# Patient Record
Sex: Male | Born: 1975 | Race: White | Hispanic: No | Marital: Married | Smoking: Current every day smoker
Health system: Southern US, Community
[De-identification: ages and names within clinical notes are randomized; demographics above are authoritative.]

## PROBLEM LIST (undated history)

## (undated) DIAGNOSIS — K529 Noninfective gastroenteritis and colitis, unspecified: Secondary | ICD-10-CM

## (undated) HISTORY — PX: BACK SURGERY: SHX140

## (undated) HISTORY — PX: OTHER SURGICAL HISTORY: SHX169

---

## 2000-06-22 ENCOUNTER — Encounter: Payer: Self-pay | Admitting: Emergency Medicine

## 2000-06-22 ENCOUNTER — Emergency Department (HOSPITAL_COMMUNITY): Admission: EM | Admit: 2000-06-22 | Discharge: 2000-06-22 | Payer: Self-pay | Admitting: Emergency Medicine

## 2000-07-01 ENCOUNTER — Emergency Department (HOSPITAL_COMMUNITY): Admission: EM | Admit: 2000-07-01 | Discharge: 2000-07-01 | Payer: Self-pay | Admitting: Emergency Medicine

## 2000-10-30 ENCOUNTER — Emergency Department (HOSPITAL_COMMUNITY): Admission: EM | Admit: 2000-10-30 | Discharge: 2000-10-30 | Payer: Self-pay | Admitting: Internal Medicine

## 2000-11-30 ENCOUNTER — Encounter: Payer: Self-pay | Admitting: Internal Medicine

## 2000-11-30 ENCOUNTER — Emergency Department (HOSPITAL_COMMUNITY): Admission: EM | Admit: 2000-11-30 | Discharge: 2000-11-30 | Payer: Self-pay | Admitting: Internal Medicine

## 2000-12-09 ENCOUNTER — Encounter: Payer: Self-pay | Admitting: Emergency Medicine

## 2000-12-09 ENCOUNTER — Emergency Department (HOSPITAL_COMMUNITY): Admission: EM | Admit: 2000-12-09 | Discharge: 2000-12-09 | Payer: Self-pay | Admitting: Emergency Medicine

## 2000-12-25 ENCOUNTER — Emergency Department (HOSPITAL_COMMUNITY): Admission: EM | Admit: 2000-12-25 | Discharge: 2000-12-26 | Payer: Self-pay | Admitting: *Deleted

## 2001-01-01 ENCOUNTER — Emergency Department (HOSPITAL_COMMUNITY): Admission: EM | Admit: 2001-01-01 | Discharge: 2001-01-01 | Payer: Self-pay | Admitting: Emergency Medicine

## 2001-03-08 ENCOUNTER — Emergency Department (HOSPITAL_COMMUNITY): Admission: EM | Admit: 2001-03-08 | Discharge: 2001-03-08 | Payer: Self-pay | Admitting: *Deleted

## 2001-03-24 ENCOUNTER — Emergency Department (HOSPITAL_COMMUNITY): Admission: EM | Admit: 2001-03-24 | Discharge: 2001-03-24 | Payer: Self-pay | Admitting: Emergency Medicine

## 2001-03-27 ENCOUNTER — Encounter: Payer: Self-pay | Admitting: Emergency Medicine

## 2001-03-27 ENCOUNTER — Emergency Department (HOSPITAL_COMMUNITY): Admission: EM | Admit: 2001-03-27 | Discharge: 2001-03-27 | Payer: Self-pay | Admitting: Emergency Medicine

## 2001-04-15 ENCOUNTER — Inpatient Hospital Stay (HOSPITAL_COMMUNITY): Admission: AC | Admit: 2001-04-15 | Discharge: 2001-04-17 | Payer: Self-pay

## 2001-04-15 ENCOUNTER — Encounter: Payer: Self-pay | Admitting: Emergency Medicine

## 2001-04-15 ENCOUNTER — Encounter: Payer: Self-pay | Admitting: Neurosurgery

## 2001-04-26 ENCOUNTER — Emergency Department (HOSPITAL_COMMUNITY): Admission: EM | Admit: 2001-04-26 | Discharge: 2001-04-26 | Payer: Self-pay

## 2001-04-29 ENCOUNTER — Encounter: Payer: Self-pay | Admitting: *Deleted

## 2001-04-29 ENCOUNTER — Emergency Department (HOSPITAL_COMMUNITY): Admission: EM | Admit: 2001-04-29 | Discharge: 2001-04-29 | Payer: Self-pay | Admitting: *Deleted

## 2001-04-30 ENCOUNTER — Emergency Department (HOSPITAL_COMMUNITY): Admission: EM | Admit: 2001-04-30 | Discharge: 2001-05-01 | Payer: Self-pay | Admitting: *Deleted

## 2001-06-10 ENCOUNTER — Emergency Department (HOSPITAL_COMMUNITY): Admission: EM | Admit: 2001-06-10 | Discharge: 2001-06-10 | Payer: Self-pay | Admitting: *Deleted

## 2001-06-18 ENCOUNTER — Emergency Department (HOSPITAL_COMMUNITY): Admission: EM | Admit: 2001-06-18 | Discharge: 2001-06-18 | Payer: Self-pay | Admitting: Emergency Medicine

## 2001-07-02 ENCOUNTER — Emergency Department (HOSPITAL_COMMUNITY): Admission: EM | Admit: 2001-07-02 | Discharge: 2001-07-02 | Payer: Self-pay | Admitting: *Deleted

## 2001-07-02 ENCOUNTER — Encounter: Payer: Self-pay | Admitting: *Deleted

## 2001-07-16 ENCOUNTER — Emergency Department (HOSPITAL_COMMUNITY): Admission: EM | Admit: 2001-07-16 | Discharge: 2001-07-16 | Payer: Self-pay | Admitting: Emergency Medicine

## 2001-07-18 ENCOUNTER — Emergency Department (HOSPITAL_COMMUNITY): Admission: EM | Admit: 2001-07-18 | Discharge: 2001-07-18 | Payer: Self-pay | Admitting: Emergency Medicine

## 2001-07-21 ENCOUNTER — Emergency Department (HOSPITAL_COMMUNITY): Admission: EM | Admit: 2001-07-21 | Discharge: 2001-07-21 | Payer: Self-pay | Admitting: *Deleted

## 2001-07-23 ENCOUNTER — Emergency Department (HOSPITAL_COMMUNITY): Admission: EM | Admit: 2001-07-23 | Discharge: 2001-07-23 | Payer: Self-pay | Admitting: Emergency Medicine

## 2001-07-29 ENCOUNTER — Emergency Department (HOSPITAL_COMMUNITY): Admission: EM | Admit: 2001-07-29 | Discharge: 2001-07-29 | Payer: Self-pay | Admitting: Emergency Medicine

## 2001-08-03 ENCOUNTER — Emergency Department (HOSPITAL_COMMUNITY): Admission: EM | Admit: 2001-08-03 | Discharge: 2001-08-03 | Payer: Self-pay | Admitting: Emergency Medicine

## 2001-08-06 ENCOUNTER — Emergency Department (HOSPITAL_COMMUNITY): Admission: EM | Admit: 2001-08-06 | Discharge: 2001-08-07 | Payer: Self-pay | Admitting: Internal Medicine

## 2001-08-07 ENCOUNTER — Encounter: Payer: Self-pay | Admitting: Internal Medicine

## 2001-08-24 ENCOUNTER — Emergency Department (HOSPITAL_COMMUNITY): Admission: EM | Admit: 2001-08-24 | Discharge: 2001-08-24 | Payer: Self-pay | Admitting: *Deleted

## 2001-09-14 ENCOUNTER — Emergency Department (HOSPITAL_COMMUNITY): Admission: EM | Admit: 2001-09-14 | Discharge: 2001-09-14 | Payer: Self-pay | Admitting: Emergency Medicine

## 2002-01-30 ENCOUNTER — Encounter: Payer: Self-pay | Admitting: Emergency Medicine

## 2002-01-30 ENCOUNTER — Emergency Department (HOSPITAL_COMMUNITY): Admission: EM | Admit: 2002-01-30 | Discharge: 2002-01-30 | Payer: Self-pay | Admitting: Emergency Medicine

## 2004-09-22 ENCOUNTER — Emergency Department (HOSPITAL_COMMUNITY): Admission: EM | Admit: 2004-09-22 | Discharge: 2004-09-23 | Payer: Self-pay | Admitting: *Deleted

## 2004-09-25 ENCOUNTER — Emergency Department: Payer: Self-pay | Admitting: Emergency Medicine

## 2004-10-16 ENCOUNTER — Emergency Department (HOSPITAL_COMMUNITY): Admission: EM | Admit: 2004-10-16 | Discharge: 2004-10-16 | Payer: Self-pay | Admitting: Emergency Medicine

## 2004-10-22 ENCOUNTER — Emergency Department (HOSPITAL_COMMUNITY): Admission: EM | Admit: 2004-10-22 | Discharge: 2004-10-22 | Payer: Self-pay | Admitting: Emergency Medicine

## 2004-10-24 ENCOUNTER — Emergency Department (HOSPITAL_COMMUNITY): Admission: EM | Admit: 2004-10-24 | Discharge: 2004-10-24 | Payer: Self-pay | Admitting: *Deleted

## 2004-12-16 ENCOUNTER — Emergency Department (HOSPITAL_COMMUNITY): Admission: EM | Admit: 2004-12-16 | Discharge: 2004-12-16 | Payer: Self-pay | Admitting: Emergency Medicine

## 2005-02-04 ENCOUNTER — Emergency Department (HOSPITAL_COMMUNITY): Admission: EM | Admit: 2005-02-04 | Discharge: 2005-02-05 | Payer: Self-pay | Admitting: *Deleted

## 2005-02-13 ENCOUNTER — Emergency Department (HOSPITAL_COMMUNITY): Admission: EM | Admit: 2005-02-13 | Discharge: 2005-02-13 | Payer: Self-pay | Admitting: Emergency Medicine

## 2005-02-27 ENCOUNTER — Emergency Department (HOSPITAL_COMMUNITY): Admission: EM | Admit: 2005-02-27 | Discharge: 2005-02-27 | Payer: Self-pay | Admitting: Emergency Medicine

## 2005-03-20 ENCOUNTER — Emergency Department (HOSPITAL_COMMUNITY): Admission: EM | Admit: 2005-03-20 | Discharge: 2005-03-20 | Payer: Self-pay | Admitting: Emergency Medicine

## 2005-04-06 ENCOUNTER — Emergency Department (HOSPITAL_COMMUNITY): Admission: EM | Admit: 2005-04-06 | Discharge: 2005-04-06 | Payer: Self-pay | Admitting: Emergency Medicine

## 2005-04-23 ENCOUNTER — Emergency Department (HOSPITAL_COMMUNITY): Admission: EM | Admit: 2005-04-23 | Discharge: 2005-04-23 | Payer: Self-pay | Admitting: Emergency Medicine

## 2005-06-03 ENCOUNTER — Emergency Department (HOSPITAL_COMMUNITY): Admission: EM | Admit: 2005-06-03 | Discharge: 2005-06-04 | Payer: Self-pay | Admitting: Emergency Medicine

## 2005-06-11 ENCOUNTER — Emergency Department (HOSPITAL_COMMUNITY): Admission: EM | Admit: 2005-06-11 | Discharge: 2005-06-11 | Payer: Self-pay | Admitting: Emergency Medicine

## 2005-06-27 ENCOUNTER — Emergency Department (HOSPITAL_COMMUNITY): Admission: EM | Admit: 2005-06-27 | Discharge: 2005-06-27 | Payer: Self-pay | Admitting: Emergency Medicine

## 2005-07-25 ENCOUNTER — Emergency Department (HOSPITAL_COMMUNITY): Admission: EM | Admit: 2005-07-25 | Discharge: 2005-07-25 | Payer: Self-pay | Admitting: Emergency Medicine

## 2005-07-31 ENCOUNTER — Emergency Department (HOSPITAL_COMMUNITY): Admission: EM | Admit: 2005-07-31 | Discharge: 2005-08-01 | Payer: Self-pay | Admitting: Emergency Medicine

## 2005-08-09 ENCOUNTER — Emergency Department (HOSPITAL_COMMUNITY): Admission: EM | Admit: 2005-08-09 | Discharge: 2005-08-10 | Payer: Self-pay | Admitting: Emergency Medicine

## 2005-08-13 ENCOUNTER — Emergency Department (HOSPITAL_COMMUNITY): Admission: EM | Admit: 2005-08-13 | Discharge: 2005-08-13 | Payer: Self-pay | Admitting: Emergency Medicine

## 2005-08-14 ENCOUNTER — Emergency Department (HOSPITAL_COMMUNITY): Admission: EM | Admit: 2005-08-14 | Discharge: 2005-08-14 | Payer: Self-pay | Admitting: Emergency Medicine

## 2005-08-16 ENCOUNTER — Ambulatory Visit: Payer: Self-pay | Admitting: Orthopedic Surgery

## 2005-08-20 ENCOUNTER — Ambulatory Visit: Payer: Self-pay | Admitting: Orthopedic Surgery

## 2005-08-20 ENCOUNTER — Emergency Department (HOSPITAL_COMMUNITY): Admission: EM | Admit: 2005-08-20 | Discharge: 2005-08-20 | Payer: Self-pay | Admitting: Emergency Medicine

## 2005-08-26 ENCOUNTER — Emergency Department (HOSPITAL_COMMUNITY): Admission: EM | Admit: 2005-08-26 | Discharge: 2005-08-26 | Payer: Self-pay | Admitting: Emergency Medicine

## 2005-08-29 ENCOUNTER — Ambulatory Visit: Payer: Self-pay | Admitting: Orthopedic Surgery

## 2005-09-03 ENCOUNTER — Emergency Department (HOSPITAL_COMMUNITY): Admission: EM | Admit: 2005-09-03 | Discharge: 2005-09-03 | Payer: Self-pay | Admitting: Emergency Medicine

## 2005-09-13 ENCOUNTER — Ambulatory Visit: Payer: Self-pay | Admitting: Orthopedic Surgery

## 2005-09-14 ENCOUNTER — Emergency Department (HOSPITAL_COMMUNITY): Admission: EM | Admit: 2005-09-14 | Discharge: 2005-09-14 | Payer: Self-pay | Admitting: Emergency Medicine

## 2005-10-01 ENCOUNTER — Emergency Department (HOSPITAL_COMMUNITY): Admission: EM | Admit: 2005-10-01 | Discharge: 2005-10-01 | Payer: Self-pay | Admitting: Emergency Medicine

## 2005-10-04 ENCOUNTER — Ambulatory Visit: Payer: Self-pay | Admitting: Orthopedic Surgery

## 2005-10-09 ENCOUNTER — Emergency Department (HOSPITAL_COMMUNITY): Admission: EM | Admit: 2005-10-09 | Discharge: 2005-10-09 | Payer: Self-pay | Admitting: Emergency Medicine

## 2005-10-24 ENCOUNTER — Ambulatory Visit: Payer: Self-pay | Admitting: Orthopedic Surgery

## 2005-11-12 ENCOUNTER — Emergency Department (HOSPITAL_COMMUNITY): Admission: EM | Admit: 2005-11-12 | Discharge: 2005-11-12 | Payer: Self-pay | Admitting: Emergency Medicine

## 2005-11-21 ENCOUNTER — Ambulatory Visit: Payer: Self-pay | Admitting: Orthopedic Surgery

## 2005-12-18 ENCOUNTER — Emergency Department (HOSPITAL_COMMUNITY): Admission: EM | Admit: 2005-12-18 | Discharge: 2005-12-18 | Payer: Self-pay | Admitting: Emergency Medicine

## 2005-12-19 ENCOUNTER — Emergency Department (HOSPITAL_COMMUNITY): Admission: EM | Admit: 2005-12-19 | Discharge: 2005-12-19 | Payer: Self-pay | Admitting: Emergency Medicine

## 2005-12-22 ENCOUNTER — Emergency Department (HOSPITAL_COMMUNITY): Admission: EM | Admit: 2005-12-22 | Discharge: 2005-12-22 | Payer: Self-pay | Admitting: Emergency Medicine

## 2005-12-23 ENCOUNTER — Emergency Department (HOSPITAL_COMMUNITY): Admission: EM | Admit: 2005-12-23 | Discharge: 2005-12-23 | Payer: Self-pay | Admitting: Emergency Medicine

## 2006-01-06 ENCOUNTER — Emergency Department (HOSPITAL_COMMUNITY): Admission: EM | Admit: 2006-01-06 | Discharge: 2006-01-06 | Payer: Self-pay | Admitting: Emergency Medicine

## 2006-01-17 ENCOUNTER — Emergency Department (HOSPITAL_COMMUNITY): Admission: EM | Admit: 2006-01-17 | Discharge: 2006-01-17 | Payer: Self-pay | Admitting: Emergency Medicine

## 2006-02-18 ENCOUNTER — Emergency Department (HOSPITAL_COMMUNITY): Admission: EM | Admit: 2006-02-18 | Discharge: 2006-02-19 | Payer: Self-pay | Admitting: Emergency Medicine

## 2006-03-03 ENCOUNTER — Emergency Department (HOSPITAL_COMMUNITY): Admission: EM | Admit: 2006-03-03 | Discharge: 2006-03-03 | Payer: Self-pay | Admitting: Emergency Medicine

## 2006-04-20 ENCOUNTER — Emergency Department (HOSPITAL_COMMUNITY): Admission: EM | Admit: 2006-04-20 | Discharge: 2006-04-20 | Payer: Self-pay | Admitting: Emergency Medicine

## 2006-06-21 ENCOUNTER — Emergency Department (HOSPITAL_COMMUNITY): Admission: EM | Admit: 2006-06-21 | Discharge: 2006-06-22 | Payer: Self-pay | Admitting: Emergency Medicine

## 2006-06-26 ENCOUNTER — Emergency Department (HOSPITAL_COMMUNITY): Admission: EM | Admit: 2006-06-26 | Discharge: 2006-06-26 | Payer: Self-pay | Admitting: Emergency Medicine

## 2006-07-01 ENCOUNTER — Emergency Department (HOSPITAL_COMMUNITY): Admission: EM | Admit: 2006-07-01 | Discharge: 2006-07-01 | Payer: Self-pay | Admitting: Emergency Medicine

## 2006-08-07 ENCOUNTER — Emergency Department (HOSPITAL_COMMUNITY): Admission: EM | Admit: 2006-08-07 | Discharge: 2006-08-07 | Payer: Self-pay | Admitting: Emergency Medicine

## 2006-08-12 ENCOUNTER — Emergency Department (HOSPITAL_COMMUNITY): Admission: EM | Admit: 2006-08-12 | Discharge: 2006-08-12 | Payer: Self-pay | Admitting: Emergency Medicine

## 2006-09-20 ENCOUNTER — Emergency Department (HOSPITAL_COMMUNITY): Admission: EM | Admit: 2006-09-20 | Discharge: 2006-09-20 | Payer: Self-pay | Admitting: Emergency Medicine

## 2006-12-23 ENCOUNTER — Emergency Department (HOSPITAL_COMMUNITY): Admission: EM | Admit: 2006-12-23 | Discharge: 2006-12-23 | Payer: Self-pay | Admitting: Emergency Medicine

## 2007-01-06 ENCOUNTER — Emergency Department (HOSPITAL_COMMUNITY): Admission: EM | Admit: 2007-01-06 | Discharge: 2007-01-06 | Payer: Self-pay | Admitting: Emergency Medicine

## 2008-05-27 IMAGING — CT CT L SPINE W/O CM
3 series · 16 of 33 positions shown, 19 images · IV contrast (agent unspecified)
Comparison: none

CLINICAL DATA: Fall from 15-foot scaffolding.  Severe back pain.  Pelvis and left hip pain.
LUMBAR SPINE CT WITHOUT CONTRAST:
TECHNIQUE: Multidetector CT imaging of the lumbar spine was performed.  Multiplanar CT image reconstructions were also generated.
TECHNIQUE: Multidetector CT imaging of the pelvis was performed following the standard protocol without IV contrast.
TECHNIQUE: Multidetector CT imaging of the cervical spine was performed.  Multiplanar CT image reconstructions were also generated.

[Series 4285: — · axial · 0.35mm/px · z∈[-1415,-1175]mm · 8 of 178 slices shown, 10 images]
[im 14/178  soft-tissue]
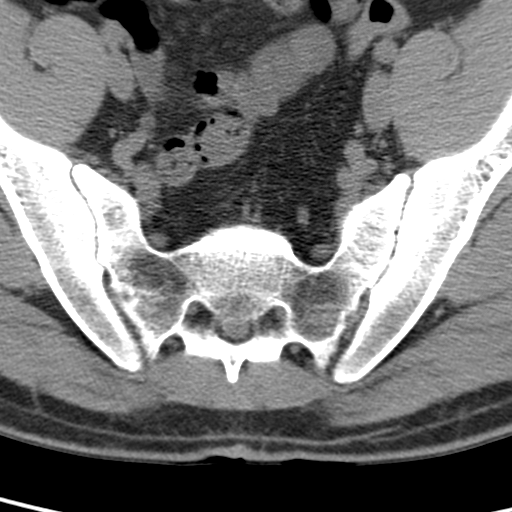
[im 14/178  bone]
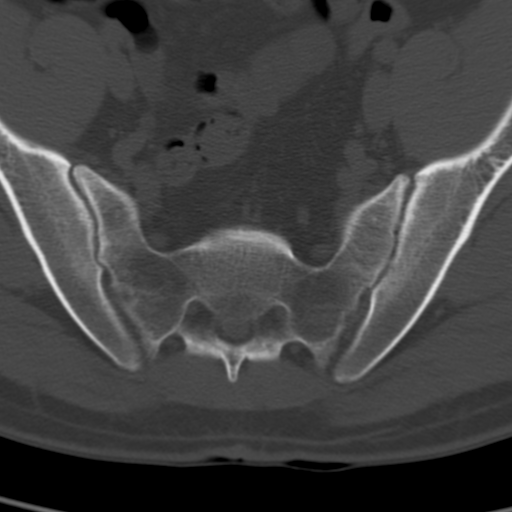
[im 41/178  bone]
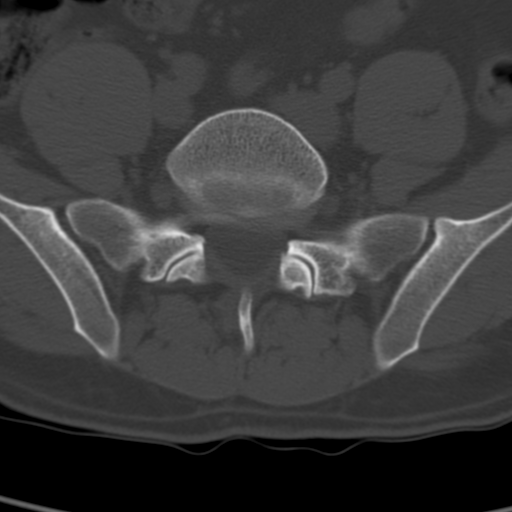
[im 55/178  bone]
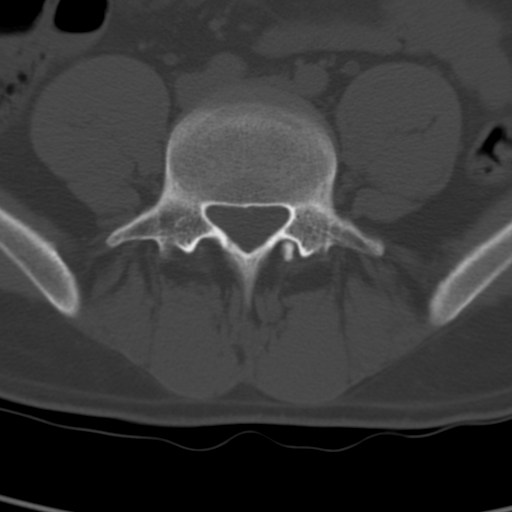
[im 82/178  bone]
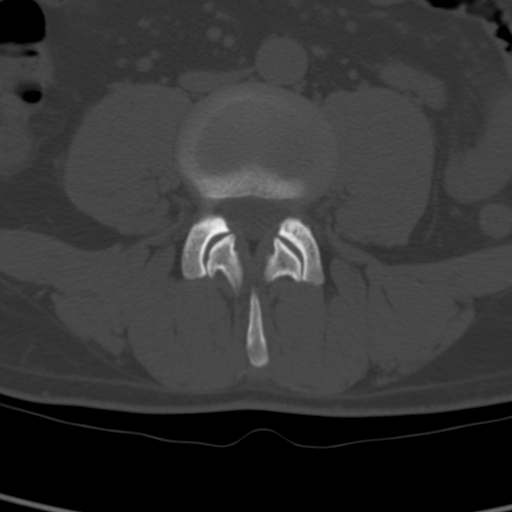
[im 96/178  soft-tissue]
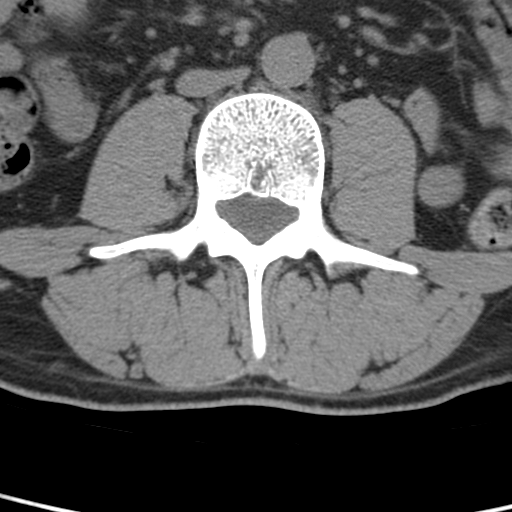
[im 96/178  bone]
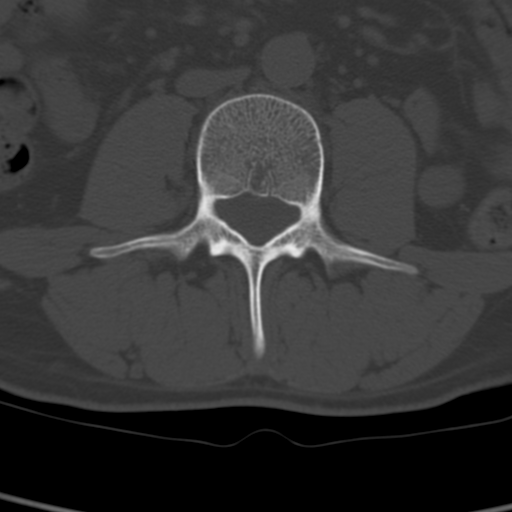
[im 123/178  bone]
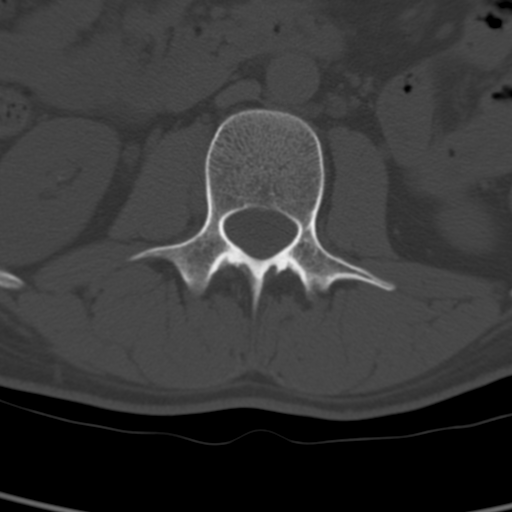
[im 137/178  bone]
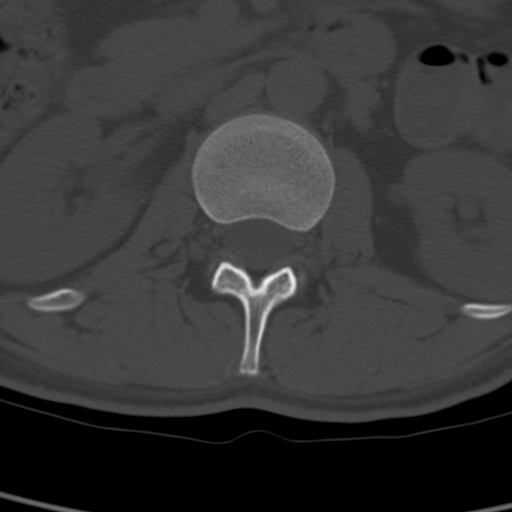
[im 164/178  bone]
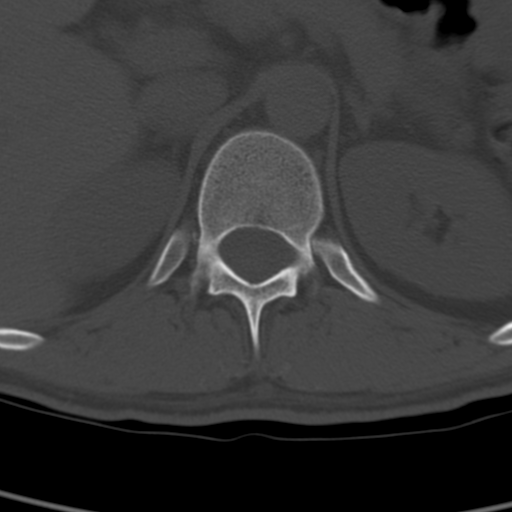

[sagittals · sagittal · 0.56mm/px · 5 of 70 slices shown, 6 images]
[im 24/70  bone]
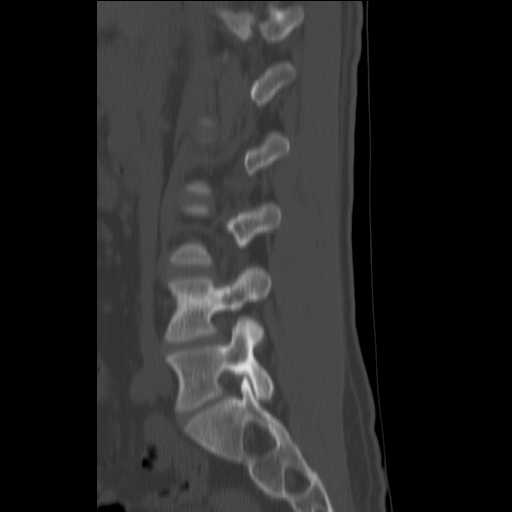
[im 29/70  bone]
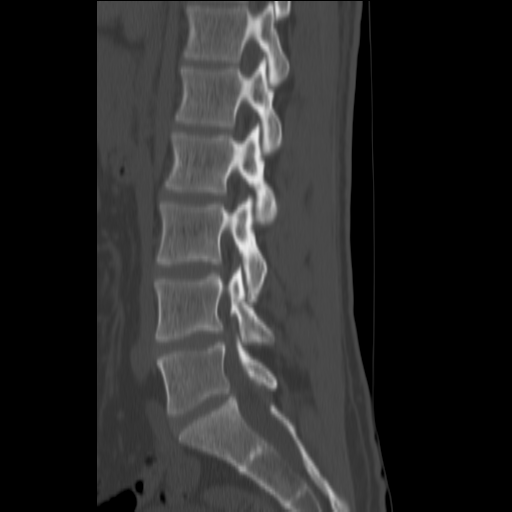
[im 35/70  soft-tissue]
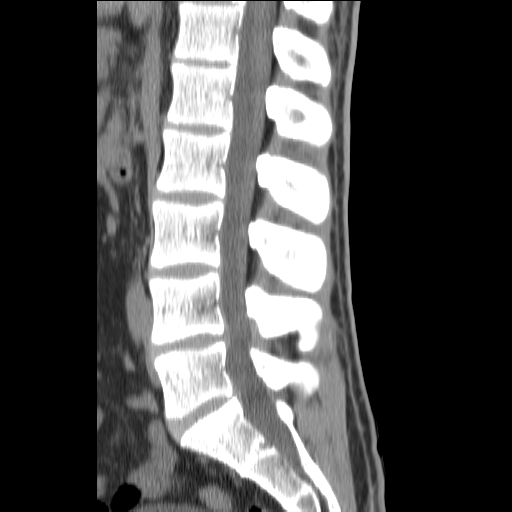
[im 35/70  bone]
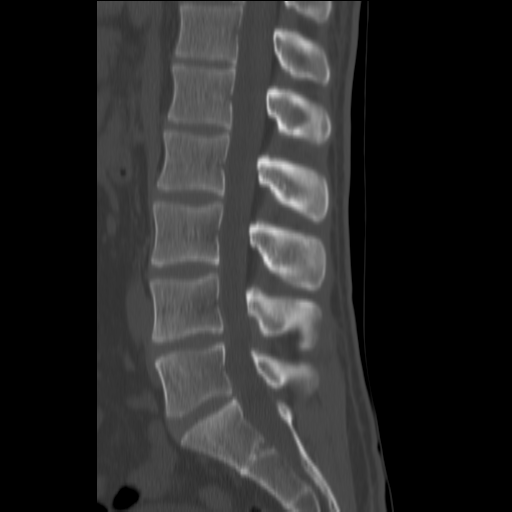
[im 41/70  bone]
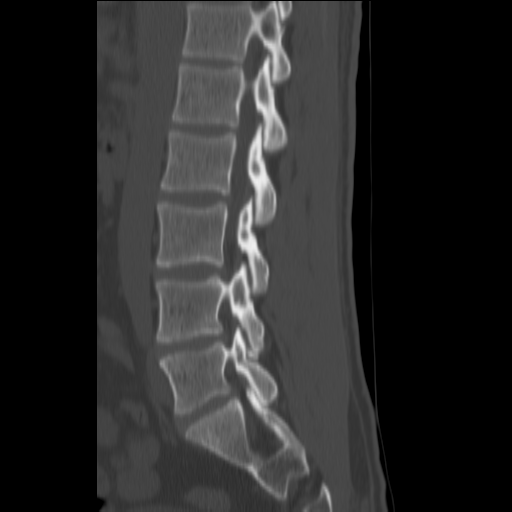
[im 47/70  bone]
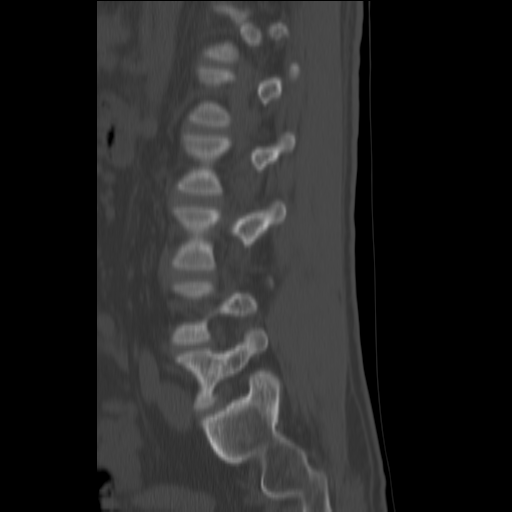

[coronals · coronal · 0.56mm/px · 3 of 55 slices shown]
[im 11/55  bone]
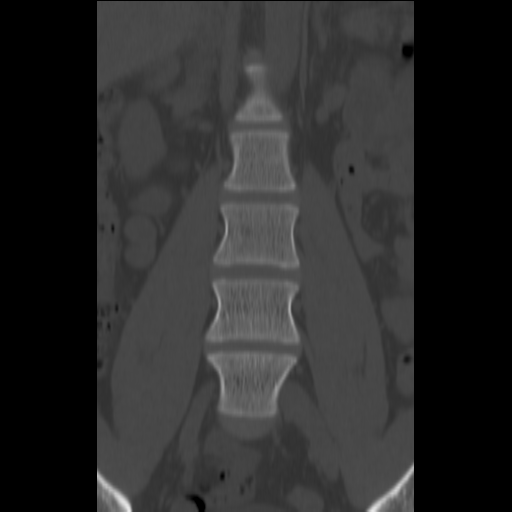
[im 22/55  bone]
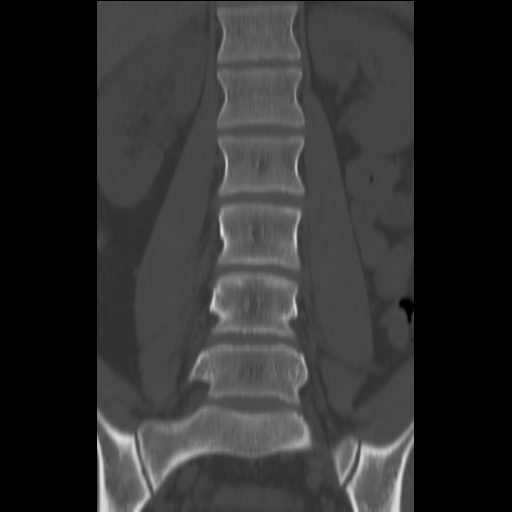
[im 33/55  bone]
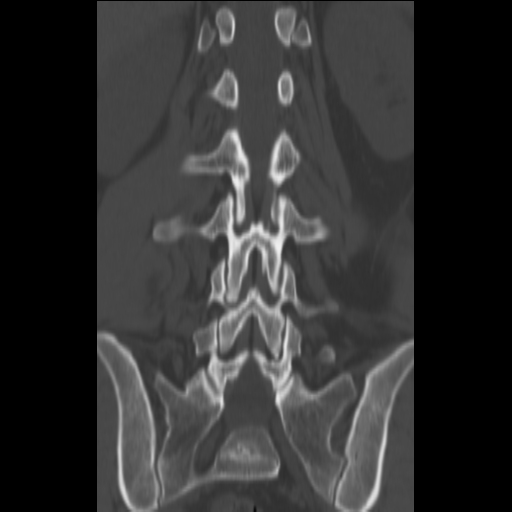

[16 of 33 positions shown; findings below may reference images not displayed]

FINDINGS: Lumbar spine alignment is anatomic.   No compression fracture or deformity.   Vertebral body heights and disc spaces are preserved.   Mild L3-4 disc bulge.   No significant stenosis.  Mild acquired stenosis at L4-5 related to a disc bulge and ligamentum flavum thickening.  Minimal L5-S1 degenerative disc disease.  No paraspinous hematoma or retroperitoneal hemorrhage.
IMPRESSION: 1.  No acute finding or fracture of the lumbar spine by CT.   
2.  L3-4, L4-5, and L5-S1 degenerative disc disease as described with mild L4-5 spinal stenosis.
PELVIS CT WITHOUT CONTRAST:
FINDINGS: Intact bony pelvis.  No displaced fracture.  Hips are aligned and symmetric.  No hip fracture.  No soft tissue surrounding hematoma.  SI joints are symmetric.  No diastasis.  Punctate bone islands are present in both femoral heads.
IMPRESSION: No acute pelvis or left hip fracture.  
CERVICAL SPINE CT WITHOUT CONTRAST:
FINDINGS: Cervical spine alignment is anatomic.  No definite compression fracture, subluxation, or dislocation.  Facets are aligned.  Foramina are patent.  Minimal marginal calcification of the anterior disc along the endplates at C6-7.
IMPRESSION: No acute fracture or static injury of the cervical spine.
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
## 2010-12-26 LAB — CBC
HCT: 41.5
Hemoglobin: 14.1
MCHC: 33.9
MCV: 85.9
RBC: 4.82
RDW: 13.1

## 2010-12-26 LAB — URINE CULTURE: Colony Count: NO GROWTH

## 2010-12-26 LAB — URINALYSIS, ROUTINE W REFLEX MICROSCOPIC
Glucose, UA: NEGATIVE
Leukocytes, UA: NEGATIVE
pH: 6

## 2010-12-26 LAB — BASIC METABOLIC PANEL
CO2: 30
Calcium: 8.5
Chloride: 100
GFR calc Af Amer: >60
Glucose, Bld: 86
Potassium: 3.9
Sodium: 135

## 2010-12-26 LAB — URINE MICROSCOPIC-ADD ON

## 2010-12-26 LAB — DIFFERENTIAL
Lymphocytes Relative: 42
Lymphs Abs: 3.4 — ABNORMAL HIGH
Neutrophils Relative %: 47

## 2020-03-30 ENCOUNTER — Emergency Department (HOSPITAL_COMMUNITY)
Admission: EM | Admit: 2020-03-30 | Discharge: 2020-03-30 | Disposition: A | Discharge status: Home / Self Care | Payer: Self-pay

## 2020-03-30 ENCOUNTER — Other Ambulatory Visit: Payer: Self-pay

## 2020-05-21 ENCOUNTER — Emergency Department (HOSPITAL_COMMUNITY): Payer: Self-pay

## 2020-05-21 ENCOUNTER — Encounter (HOSPITAL_COMMUNITY): Payer: Self-pay | Admitting: Emergency Medicine

## 2020-05-21 ENCOUNTER — Other Ambulatory Visit: Payer: Self-pay

## 2020-05-21 ENCOUNTER — Emergency Department (HOSPITAL_COMMUNITY)
Admission: EM | Admit: 2020-05-21 | Discharge: 2020-05-21 | Disposition: A | Discharge status: Home / Self Care | Payer: Self-pay | Attending: Emergency Medicine | Admitting: Emergency Medicine

## 2020-05-21 DIAGNOSIS — R112 Nausea with vomiting, unspecified: Secondary | ICD-10-CM

## 2020-05-21 DIAGNOSIS — F17210 Nicotine dependence, cigarettes, uncomplicated: Secondary | ICD-10-CM | POA: Insufficient documentation

## 2020-05-21 DIAGNOSIS — F12188 Cannabis abuse with other cannabis-induced disorder: Secondary | ICD-10-CM | POA: Insufficient documentation

## 2020-05-21 HISTORY — DX: Noninfective gastroenteritis and colitis, unspecified: K52.9

## 2020-05-21 LAB — CBC
HCT: 46.3 % (ref 39.0–52.0)
Hemoglobin: 15.6 g/dL (ref 13.0–17.0)
MCH: 29.3 pg (ref 26.0–34.0)
MCHC: 33.7 g/dL (ref 30.0–36.0)
MCV: 86.9 fL (ref 80.0–100.0)
Platelets: 220 10*3/uL (ref 150–400)
RBC: 5.33 MIL/uL (ref 4.22–5.81)
RDW: 12.8 % (ref 11.5–15.5)
WBC: 15 10*3/uL — ABNORMAL HIGH (ref 4.0–10.5)
nRBC: 0 % (ref 0.0–0.2)

## 2020-05-21 LAB — COMPREHENSIVE METABOLIC PANEL
ALT: 21 U/L (ref 0–44)
AST: 28 U/L (ref 15–41)
Albumin: 4.6 g/dL (ref 3.5–5.0)
Alkaline Phosphatase: 62 U/L (ref 38–126)
Anion gap: 12 (ref 5–15)
BUN: 26 mg/dL — ABNORMAL HIGH (ref 6–20)
CO2: 28 mmol/L (ref 22–32)
Calcium: 9.6 mg/dL (ref 8.9–10.3)
Chloride: 97 mmol/L — ABNORMAL LOW (ref 98–111)
Creatinine, Ser: 0.91 mg/dL (ref 0.61–1.24)
GFR, Estimated: >60 mL/min (ref >60)
Glucose, Bld: 116 mg/dL — ABNORMAL HIGH (ref 70–99)
Potassium: 3.3 mmol/L — ABNORMAL LOW (ref 3.5–5.1)
Sodium: 137 mmol/L (ref 135–145)
Total Bilirubin: 1.1 mg/dL (ref 0.3–1.2)
Total Protein: 7.7 g/dL (ref 6.5–8.1)

## 2020-05-21 LAB — URINALYSIS, ROUTINE W REFLEX MICROSCOPIC
Bilirubin Urine: NEGATIVE
Glucose, UA: NEGATIVE mg/dL
Hgb urine dipstick: NEGATIVE
Ketones, ur: 20 mg/dL — AB
Leukocytes,Ua: NEGATIVE
Nitrite: NEGATIVE
Protein, ur: NEGATIVE mg/dL
Specific Gravity, Urine: >1.046 — ABNORMAL HIGH (ref 1.005–1.030)
pH: 6 (ref 5.0–8.0)

## 2020-05-21 LAB — LIPASE, BLOOD: Lipase: 104 U/L — ABNORMAL HIGH (ref 11–51)

## 2020-05-21 MED ORDER — ALUM & MAG HYDROXIDE-SIMETH 200-200-20 MG/5ML PO SUSP
30.0000 mL | Freq: Once | ORAL | Status: AC
  Administered 2020-05-21: 30 mL via ORAL
  Filled 2020-05-21: qty 30

## 2020-05-21 MED ORDER — FENTANYL CITRATE (PF) 100 MCG/2ML IJ SOLN
50.0000 ug | Freq: Once | INTRAMUSCULAR | Status: AC
  Administered 2020-05-21: 50 ug via INTRAVENOUS
  Filled 2020-05-21: qty 2

## 2020-05-21 MED ORDER — ONDANSETRON HCL 4 MG/2ML IJ SOLN
4.0000 mg | Freq: Once | INTRAMUSCULAR | Status: AC
  Administered 2020-05-21: 4 mg via INTRAVENOUS
  Filled 2020-05-21: qty 2

## 2020-05-21 MED ORDER — ONDANSETRON HCL 4 MG/2ML IJ SOLN
4.0000 mg | Freq: Once | INTRAMUSCULAR | Status: AC | PRN
  Administered 2020-05-21: 4 mg via INTRAVENOUS
  Filled 2020-05-21: qty 2

## 2020-05-21 MED ORDER — IOHEXOL 9 MG/ML PO SOLN
ORAL | Status: AC
  Filled 2020-05-21: qty 1000

## 2020-05-21 MED ORDER — IOHEXOL 300 MG/ML  SOLN
100.0000 mL | Freq: Once | INTRAMUSCULAR | Status: AC | PRN
  Administered 2020-05-21: 100 mL via INTRAVENOUS

## 2020-05-21 MED ORDER — PANTOPRAZOLE SODIUM 40 MG IV SOLR
40.0000 mg | Freq: Once | INTRAVENOUS | Status: AC
  Administered 2020-05-21: 40 mg via INTRAVENOUS
  Filled 2020-05-21: qty 40

## 2020-05-21 MED ORDER — SODIUM CHLORIDE 0.9 % IV BOLUS
1000.0000 mL | Freq: Once | INTRAVENOUS | Status: AC
  Administered 2020-05-21: 1000 mL via INTRAVENOUS

## 2020-05-21 MED ORDER — ONDANSETRON 4 MG PO TBDP: 4.0000 mg | ORAL_TABLET | Freq: Three times a day (TID) | ORAL | 0 refills | Status: AC | PRN

## 2020-05-21 NOTE — ED Provider Notes (Signed)
[facility] EMERGENCY DEPARTMENT Provider Note  CSN: [id number] Arrival date & time: 05/21/20 1317    History Chief Complaint  Patient presents with  . Abdominal Pain    HPI  Lucas Sullivan is a 45 y.o. male with no significant PMH was remotely diagnosed with colitis, but not currently taking any medications by prescription. Occasional aleve over the counter. Reports 4-5 days of persistent vomiting, upper abdominal cramping pain. No BM in 2 days. No fever. Pain comes before the vomiting. He reports emesis was initially yellow but now having 'clots' of blood.    Past Medical History:  Diagnosis Date  . Colitis     Past Surgical History:  Procedure Laterality Date  . BACK SURGERY    . rt arm surgery      History reviewed. No pertinent family history.  Social History   Tobacco Use  . Smoking status: Current Every Day Smoker    Packs/day: 1.00    Types: Cigarettes  . Smokeless tobacco: Never Used  Vaping Use  . Vaping Use: Never used  Substance Use Topics  . Alcohol use: Not Currently  . Drug use: Yes    Frequency: 4.0 times per week    Types: Marijuana     Home Medications Prior to Admission medications   Medication Sig Start Date End Date Taking? Authorizing Provider  naproxen sodium (ALEVE) 220 MG tablet Take 220-440 mg by mouth daily as needed (pain, headache).   Yes [provider]  ondansetron (ZOFRAN ODT) 4 MG disintegrating tablet Take 1 tablet (4 mg total) by mouth every 8 (eight) hours as needed for nausea or vomiting. 05/21/20  Yes Pollyann Savoy, MD     Allergies    Flexeril [cyclobenzaprine] and Penicillins   Review of Systems   Review of Systems A comprehensive review of systems was completed and negative except as noted in HPI.    Physical Exam BP 122/84 (BP Location: Left Arm)   Pulse 75   Temp 98.2 F (36.8 C) (Oral)   Resp 16   Ht 6\' 4"  (1.93 m)   Wt 81.6 kg   SpO2 97%   BMI 21.91 kg/m   Physical Exam Vitals and  nursing note reviewed.  Constitutional:      Appearance: Normal appearance.  HENT:     Head: Normocephalic and atraumatic.     Nose: Nose normal.     Mouth/Throat:     Mouth: Mucous membranes are dry.  Eyes:     Extraocular Movements: Extraocular movements intact.     Conjunctiva/sclera: Conjunctivae normal.  Cardiovascular:     Rate and Rhythm: Normal rate.  Pulmonary:     Effort: Pulmonary effort is normal.     Breath sounds: Normal breath sounds.  Abdominal:     General: Abdomen is flat.     Palpations: Abdomen is soft.     Tenderness: There is generalized abdominal tenderness. There is guarding (upper abdomen). Positive signs include Murphy's sign. Negative signs include McBurney's sign.  Musculoskeletal:        General: No swelling. Normal range of motion.     Cervical back: Neck supple.  Skin:    General: Skin is warm and dry.  Neurological:     General: No focal deficit present.     Mental Status: He is alert.  Psychiatric:        Mood and Affect: Mood normal.      ED Results / Procedures / Treatments   Labs (all  labs ordered are listed, but only abnormal results are displayed) Labs Reviewed  LIPASE, BLOOD - Abnormal; Notable for the following components:      Result Value   Lipase 104 (*)    All other components within normal limits  COMPREHENSIVE METABOLIC PANEL - Abnormal; Notable for the following components:   Potassium 3.3 (*)    Chloride 97 (*)    Glucose, Bld 116 (*)    BUN 26 (*)    All other components within normal limits  CBC - Abnormal; Notable for the following components:   WBC 15.0 (*)    All other components within normal limits  URINALYSIS, ROUTINE W REFLEX MICROSCOPIC - Abnormal; Notable for the following components:   Specific Gravity, Urine >1.046 (*)    Ketones, ur 20 (*)    All other components within normal limits    EKG None  Radiology CT Abdomen Pelvis W Contrast  Result Date: 05/21/2020 CLINICAL DATA:  Nausea and  vomiting for 4 days. EXAM: CT ABDOMEN AND PELVIS WITH CONTRAST TECHNIQUE: Multidetector CT imaging of the abdomen and pelvis was performed using the standard protocol following bolus administration of intravenous contrast. CONTRAST:  OMNIPAQUE IOHEXOL 300 MG/ML  SOLN COMPARISON:  None. FINDINGS: Lower chest: The lung bases are clear of an acute process. No pulmonary lesions or pleural effusions. Mild distal esophageal wall thickening could suggest esophagitis. Recommend clinical correlation with any symptoms of dysphagia. Hepatobiliary: No hepatic lesions or intrahepatic biliary dilatation. The gallbladder is unremarkable. No common bile duct dilatation. Pancreas: No mass, inflammation or ductal dilatation. Spleen: Normal size.  No focal lesions. Adrenals/Urinary Tract: Adrenal glands and kidneys are unremarkable. The bladder is unremarkable. Stomach/Bowel: The stomach, duodenum, small bowel colon are grossly normal. No acute inflammatory changes, mass lesions or obstructive findings. The terminal ileum is normal. The appendix is normal. Vascular/Lymphatic: Scattered aortic calcifications but no aneurysm or dissection. The branch vessels are patent. The major venous structures are patent. No mesenteric or retroperitoneal mass or adenopathy. Reproductive: The prostate gland and seminal vesicles are unremarkable. Other: No pelvic mass or adenopathy. No free pelvic fluid collections. No inguinal mass or adenopathy. No abdominal wall hernia or subcutaneous lesions. Musculoskeletal: No significant bony findings. IMPRESSION: 1. No acute abdominal/pelvic findings, mass lesions or adenopathy. 2. Mild distal esophageal wall thickening could suggest esophagitis. Recommend clinical correlation with any symptoms of dysphagia. Aortic Atherosclerosis (ICD10-I70.0). Electronically Signed   By: Rudie Meyer M.D.   On: 05/21/2020 16:19    Procedures Procedures  Medications Ordered in the ED Medications  iohexol  (OMNIPAQUE) 9 MG/ML oral solution (has no administration in time range)  ondansetron (ZOFRAN) injection 4 mg (4 mg Intravenous Given 05/21/20 1409)  fentaNYL (SUBLIMAZE) injection 50 mcg (50 mcg Intravenous Given 05/21/20 1515)  sodium chloride 0.9 % bolus 1,000 mL (0 mLs Intravenous Stopped 05/21/20 1631)  iohexol (OMNIPAQUE) 300 MG/ML solution 100 mL (100 mLs Intravenous Contrast Given 05/21/20 1556)  ondansetron (ZOFRAN) injection 4 mg (4 mg Intravenous Given 05/21/20 1526)  pantoprazole (PROTONIX) injection 40 mg (40 mg Intravenous Given 05/21/20 1630)  alum & mag hydroxide-simeth (MAALOX/MYLANTA) 200-200-20 MG/5ML suspension 30 mL (30 mLs Oral Given 05/21/20 1630)     MDM Rules/Calculators/A&P MDM Patient given Zofran prior to my evaluation, reports nausea is improved. Labs done in triage show unremarkable CMP, Lipase only mild elevated at 104 and CBC with leukocytosis but no signs of significant blood loss. Given pain and tenderness, will send for CT to eval acute process such  as PUD/perforation. Also consider acute cholecystitis and [place] if no etiology found on CT. Pain meds and IVF in the meantime.  ED Course  I have reviewed the triage vital signs and the nursing notes.  Pertinent labs & imaging results that were available during my care of the patient were reviewed by me and considered in my medical decision making (see chart for details).  Clinical Course as of 05/21/20 1925  Sat May 21, 2020  1709 CT is neg for acute process. Patient reports pain improved now. Abdomen is benign and I do not feel additional workup is necessary at this point. On further questioning he admits to daily marijuana use. He also reports the marijuana is 'the only thing that helps sometimes' and that he does get relief from hot showers. We discussed the diagnosis of cannabinoid hyperemesis and that he should discontinue marijuana use to see if it helps. He was advised to use capsaicin ointment to see if this helps as  well. Rx for Zofran for nausea and RTED for any other concerns.  [CS]    Clinical Course User Index [CS] Pollyann Savoy, MD    Final Clinical Impression(s) / ED Diagnoses Final diagnoses:  Cannabinoid hyperemesis syndrome    Rx / DC Orders ED Discharge Orders         Ordered    ondansetron (ZOFRAN ODT) 4 MG disintegrating tablet  Every 8 hours PRN        05/21/20 1712           Pollyann Savoy, MD 05/21/20 1925

## 2020-05-21 NOTE — ED Triage Notes (Signed)
Pt to the ED with c/o N/V for the last 4 days. Pt denies fevers and states he has not had a BM in the last 2 days.  Pt has been vaccinated for Covid.

## 2022-07-26 ENCOUNTER — Emergency Department (HOSPITAL_COMMUNITY)
Admission: EM | Admit: 2022-07-26 | Discharge: 2022-07-26 | Disposition: A | Discharge status: Home / Self Care | Payer: Self-pay | Attending: Emergency Medicine | Admitting: Emergency Medicine

## 2022-07-26 ENCOUNTER — Other Ambulatory Visit: Payer: Self-pay

## 2022-07-26 ENCOUNTER — Emergency Department (HOSPITAL_COMMUNITY): Payer: Self-pay

## 2022-07-26 DIAGNOSIS — S0031XA Abrasion of nose, initial encounter: Secondary | ICD-10-CM | POA: Insufficient documentation

## 2022-07-26 DIAGNOSIS — S51812A Laceration without foreign body of left forearm, initial encounter: Secondary | ICD-10-CM | POA: Insufficient documentation

## 2022-07-26 DIAGNOSIS — Y9355 Activity, bike riding: Secondary | ICD-10-CM | POA: Insufficient documentation

## 2022-07-26 DIAGNOSIS — Y92093 Driveway of other non-institutional residence as the place of occurrence of the external cause: Secondary | ICD-10-CM | POA: Insufficient documentation

## 2022-07-26 MED ORDER — OXYCODONE-ACETAMINOPHEN 5-325 MG PO TABS
2.0000 | ORAL_TABLET | Freq: Once | ORAL | Status: AC
  Administered 2022-07-26: 2 via ORAL
  Filled 2022-07-26: qty 2

## 2022-07-26 MED ORDER — LIDOCAINE-EPINEPHRINE (PF) 1 %-1:200000 IJ SOLN
10.0000 mL | Freq: Once | INTRAMUSCULAR | Status: AC
  Administered 2022-07-26: 10 mL
  Filled 2022-07-26: qty 30
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
## 2022-07-26 NOTE — Discharge Instructions (Addendum)
As we discussed, you may shower appropriately.  Please keep wounds clean and dry.  You may take 600 mg of ibuprofen every 6 hours as needed for pain.  Please return to the emergency room for any worsening symptoms.

## 2022-07-26 NOTE — ED Notes (Signed)
Pt refused to have left arm wound wrapped
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
